# Patient Record
Sex: Female | Born: 1990 | Race: Black or African American | Hispanic: No | Marital: Single | Smoking: Never smoker
Health system: Southern US, Community
[De-identification: ages and names within clinical notes are randomized; demographics above are authoritative.]

## PROBLEM LIST (undated history)

## (undated) DIAGNOSIS — I10 Essential (primary) hypertension: Secondary | ICD-10-CM

---

## 2011-03-26 ENCOUNTER — Emergency Department (HOSPITAL_COMMUNITY): Payer: No Typology Code available for payment source

## 2011-03-26 ENCOUNTER — Emergency Department (HOSPITAL_COMMUNITY)
Admission: EM | Admit: 2011-03-26 | Discharge: 2011-03-26 | Disposition: A | Discharge status: Home / Self Care | Payer: No Typology Code available for payment source | Attending: Emergency Medicine | Admitting: Emergency Medicine

## 2011-03-26 ENCOUNTER — Encounter (HOSPITAL_COMMUNITY): Payer: Self-pay | Admitting: Adult Health

## 2011-03-26 DIAGNOSIS — I10 Essential (primary) hypertension: Secondary | ICD-10-CM | POA: Insufficient documentation

## 2011-03-26 DIAGNOSIS — S99911A Unspecified injury of right ankle, initial encounter: Secondary | ICD-10-CM

## 2011-03-26 DIAGNOSIS — Y92410 Unspecified street and highway as the place of occurrence of the external cause: Secondary | ICD-10-CM | POA: Insufficient documentation

## 2011-03-26 DIAGNOSIS — S8990XA Unspecified injury of unspecified lower leg, initial encounter: Secondary | ICD-10-CM | POA: Insufficient documentation

## 2011-03-26 HISTORY — DX: Essential (primary) hypertension: I10

## 2011-03-26 MED ORDER — CYCLOBENZAPRINE HCL 5 MG PO TABS: 5.0000 mg | ORAL_TABLET | Freq: Three times a day (TID) | ORAL | Status: AC | PRN

## 2011-03-26 MED ORDER — IBUPROFEN 600 MG PO TABS: 600.0000 mg | ORAL_TABLET | Freq: Four times a day (QID) | ORAL | Status: AC | PRN
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
## 2011-03-30 NOTE — ED Provider Notes (Signed)
Medical screening examination/treatment/procedure(s) were performed by non-physician practitioner and as supervising physician I was immediately available for consultation/collaboration.   Chevelle Durr A. Patrica Duel, MD 03/30/11 1610

## 2013-04-06 ENCOUNTER — Ambulatory Visit (INDEPENDENT_AMBULATORY_CARE_PROVIDER_SITE_OTHER): Payer: BC Managed Care – PPO | Admitting: Physician Assistant

## 2013-04-06 VITALS — BP 120/82 | HR 70 | Temp 98.0°F | Resp 16 | Ht 65.0 in | Wt 179.0 lb

## 2013-04-06 DIAGNOSIS — J020 Streptococcal pharyngitis: Secondary | ICD-10-CM

## 2013-04-06 DIAGNOSIS — J029 Acute pharyngitis, unspecified: Secondary | ICD-10-CM

## 2013-04-06 DIAGNOSIS — I10 Essential (primary) hypertension: Secondary | ICD-10-CM

## 2013-04-06 LAB — POCT RAPID STREP A (OFFICE): Rapid Strep A Screen: POSITIVE — AB

## 2013-04-06 MED ORDER — AMLODIPINE BESYLATE 2.5 MG PO TABS: 2.5000 mg | ORAL_TABLET | Freq: Every day | ORAL | Status: DC

## 2013-04-06 MED ORDER — AMOXICILLIN 875 MG PO TABS: 875.0000 mg | ORAL_TABLET | Freq: Two times a day (BID) | ORAL | Status: AC

## 2013-04-06 NOTE — Progress Notes (Signed)
Subjective:    Patient ID: Evelyn Carlson, female    DOB: March 09, 1990, 23 y.o.   MRN: [id number]  HPI Primary Physician: No primary provider on file.  Chief Complaint: Medication refill  HPI: 23 y.o. female with history below presents for refill of her amlodipine. Patient originally diagnosed with hypertension around age 89 or 33. She has gone through a thorough workup evaluating her heart and kidneys not finding a cause. It was determined this was hereditary in etiology as both her mother and father have hypertension. She has been on Norvasc 5 mg since this diagnosis. She tolerates this well. She has been out of this medication for about the past 5 days. She would like to come off an antihypertensive one day if possible. She has worked hard the past year and lost around 15 pounds. She has improved her diet. She rarely drinks alcohol. She does not smoke and does not do drugs. She denies any chest pain, headaches, vision changes, or focal deficits.   She also mentions a couple day history of a mild sore throat. Afebrile. No chills. No congestion. No cough.    Past Medical History  Diagnosis Date  . Hypertension      Home Meds: Prior to Admission medications   Medication Sig Start Date End Date Taking? Authorizing Provider  amLODipine (NORVASC) 5 MG tablet Take 5 mg by mouth daily.   Yes Historical Provider, MD  Norgestimate-Ethinyl Estradiol Triphasic (ORTHO TRI-CYCLEN, 28,) 0.18/0.215/0.25 MG-35 MCG tablet Take 1 tablet by mouth daily.   Yes Historical Provider, MD    Allergies: No Known Allergies  History   Social History  . Marital Status: Single    Spouse Name: N/A    Number of Children: N/A  . Years of Education: N/A   Occupational History  . Not on file.   Social History Main Topics  . Smoking status: Never Smoker   . Smokeless tobacco: Not on file  . Alcohol Use: No  . Drug Use:   . Sexual Activity:    Other Topics Concern  . Not on file   Social History  Narrative  . No narrative on file     Review of Systems  Constitutional: Negative for fever, chills and fatigue.  HENT: Positive for sore throat. Negative for postnasal drip.   Eyes: Negative for photophobia, pain, discharge, redness, itching and visual disturbance.  Respiratory: Negative for cough.   Cardiovascular: Negative for chest pain.  Musculoskeletal: Negative for arthralgias, gait problem, myalgias, neck pain and neck stiffness.  Skin: Negative for color change, pallor, rash and wound.  Neurological: Negative for numbness and headaches.  Psychiatric/Behavioral: Negative for hallucinations, behavioral problems, confusion, decreased concentration and agitation. The patient is not nervous/anxious and is not hyperactive.        Objective:   Physical Exam  Physical Exam: Blood pressure 120/82, pulse 70, temperature 98 F (36.7 C), temperature source Oral, resp. rate 16, height 5\' 5"  (1.651 m), weight 179 lb (81.194 kg), last menstrual period 03/30/2013, SpO2 98.00%., Body mass index is 29.79 kg/(m^2). General: Well developed, well nourished, in no acute distress. Head: Normocephalic, atraumatic, eyes without discharge, sclera non-icteric, nares are without discharge. Bilateral auditory canals clear, TM's are without perforation, pearly grey and translucent with reflective cone of light bilaterally. Oral cavity moist, posterior pharynx without exudate, erythema, peritonsillar abscess, or post nasal drip. Uvula midline.   Neck: Supple. Mild symmetrical thyromegaly, no nodules palpated. Full ROM. No lymphadenopathy. Lungs: Clear bilaterally to auscultation without wheezes,  rales, or rhonchi. Breathing is unlabored. Heart: RRR with S1 S2. No murmurs, rubs, or gallops appreciated. Msk:  Strength and tone normal for age. Extremities/Skin: Warm and dry. No clubbing or cyanosis. No edema. No rashes or suspicious lesions. Neuro: Alert and oriented X 3. Moves all extremities spontaneously.  Gait is normal. CNII-XII grossly in tact. Psych:  Responds to questions appropriately with a normal affect.   Labs: Results for orders placed in visit on 04/06/13  POCT RAPID STREP A (OFFICE)      Result Value Ref Range   Rapid Strep A Screen Positive (*) Negative    Throat culture and BMP pending     Assessment & Plan:  23 year old female with hypertension and viral pharyngitis  1) Hypertension -Trial of reducing her Norvasc to 2.5 mg daily #30 RF 2 -She will take her BP regularly at home/[facility], if she is tolerating this decrease, parameters given, she can stop the medication with continued monitoring. If she is not tolerating this decrease she is to go back up to 5 mg daily and follow up sooner. She is to follow up in the office in 5-6 weeks for a final determination of what she should officially do regarding her antihypertensive. -Discussed healthy diet -Exercise -Weight loss -Follow up 5-6 weeks, sooner if needed  2) Strep pharyngitis -Amoxicillin 875 mg 1 po bid #20 no RF -New tooth brush -Out of training for the next 24 hours  3) Thyromegaly -If this persists at her recheck plan for full work up   Eula Listenyan Otto Felkins, MHS, PA-C [facility] [address] [place], [place] [address] [phone] [id number] [facility] 04/06/2013 5:17 PM
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
## 2013-07-24 ENCOUNTER — Other Ambulatory Visit: Payer: Self-pay | Admitting: Physician Assistant

## 2013-08-24 ENCOUNTER — Other Ambulatory Visit: Payer: Self-pay | Admitting: Physician Assistant
